# Patient Record
Sex: Female | Born: 1995 | Race: White | Hispanic: No | Marital: Single | Smoking: Current every day smoker
Health system: Southern US, Community
[De-identification: ages and names within clinical notes are randomized; demographics above are authoritative.]

---
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
## 2015-04-09 ENCOUNTER — Emergency Department
Admission: EM | Admit: 2015-04-09 | Discharge: 2015-04-09 | Disposition: A | Discharge status: Home / Self Care | Payer: Self-pay | Attending: Emergency Medicine | Admitting: Emergency Medicine

## 2015-04-09 ENCOUNTER — Emergency Department: Payer: Self-pay

## 2015-04-09 ENCOUNTER — Encounter: Payer: Self-pay | Admitting: Emergency Medicine

## 2015-04-09 DIAGNOSIS — F17210 Nicotine dependence, cigarettes, uncomplicated: Secondary | ICD-10-CM | POA: Insufficient documentation

## 2015-04-09 DIAGNOSIS — R058 Other specified cough: Secondary | ICD-10-CM

## 2015-04-09 DIAGNOSIS — R05 Cough: Secondary | ICD-10-CM

## 2015-04-09 DIAGNOSIS — J3089 Other allergic rhinitis: Secondary | ICD-10-CM | POA: Insufficient documentation

## 2015-04-09 MED ORDER — BENZONATATE 100 MG PO CAPS: 100.0000 mg | ORAL_CAPSULE | Freq: Three times a day (TID) | ORAL | Status: AC | PRN

## 2015-04-09 NOTE — Discharge Instructions (Signed)
Cough, Adult A cough helps to clear your throat and lungs. A cough may last only 2-3 weeks (acute), or it may last longer than 8 weeks (chronic). Many different things can cause a cough. A cough may be a sign of an illness or another medical condition. HOME CARE  Pay attention to any changes in your cough.  Take medicines only as told by your doctor.  If you were prescribed an antibiotic medicine, take it as told by your doctor. Do not stop taking it even if you start to feel better.  Talk with your doctor before you try using a cough medicine.  Drink enough fluid to keep your pee (urine) clear or pale yellow.  If the air is dry, use a cold steam vaporizer or humidifier in your home.  Stay away from things that make you cough at work or at home.  If your cough is worse at night, try using extra pillows to raise your head up higher while you sleep.  Do not smoke, and try not to be around smoke. If you need help quitting, ask your doctor.  Do not have caffeine.  Do not drink alcohol.  Rest as needed. GET HELP IF:  You have new problems (symptoms).  You cough up yellow fluid (pus).  Your cough does not get better after 2-3 weeks, or your cough gets worse.  Medicine does not help your cough and you are not sleeping well.  You have pain that gets worse or pain that is not helped with medicine.  You have a fever.  You are losing weight and you do not know why.  You have night sweats. GET HELP RIGHT AWAY IF:  You cough up blood.  You have trouble breathing.  Your heartbeat is very fast.   This information is not intended to replace advice given to you by your health care provider. Make sure you discuss any questions you have with your health care provider.   Document Released: 10/07/2010 Document Revised: 10/15/2014 Document Reviewed: 04/02/2014 Elsevier Interactive Patient Education 2016 [employer]  Allergic Rhinitis Allergic rhinitis is when the mucous  membranes in the nose respond to allergens. Allergens are particles in the air that cause your body to have an allergic reaction. This causes you to release allergic antibodies. Through a chain of events, these eventually cause you to release histamine into the blood stream. Although meant to protect the body, it is this release of histamine that causes your discomfort, such as frequent sneezing, congestion, and an itchy, runny nose.  CAUSES Seasonal allergic rhinitis (hay fever) is caused by pollen allergens that may come from grasses, trees, and weeds. Year-round allergic rhinitis (perennial allergic rhinitis) is caused by allergens such as house dust mites, pet dander, and mold spores. SYMPTOMS  Nasal stuffiness (congestion).  Itchy, runny nose with sneezing and tearing of the eyes. DIAGNOSIS Your health care provider can help you determine the allergen or allergens that trigger your symptoms. If you and your health care provider are unable to determine the allergen, skin or blood testing may be used. Your health care provider will diagnose your condition after taking your health history and performing a physical exam. Your health care provider may assess you for other related conditions, such as asthma, pink eye, or an ear infection. TREATMENT Allergic rhinitis does not have a cure, but it can be controlled by:  Medicines that block allergy symptoms. These may include allergy shots, nasal sprays, and oral antihistamines.  Avoiding the allergen.  Hay fever may often be treated with antihistamines in pill or nasal spray forms. Antihistamines block the effects of histamine. There are over-the-counter medicines that may help with nasal congestion and swelling around the eyes. Check with your health care provider before taking or giving this medicine. If avoiding the allergen or the medicine prescribed do not work, there are many new medicines your health care provider can prescribe. Stronger medicine  may be used if initial measures are ineffective. Desensitizing injections can be used if medicine and avoidance does not work. Desensitization is when a patient is given ongoing shots until the body becomes less sensitive to the allergen. Make sure you follow up with your health care provider if problems continue. HOME CARE INSTRUCTIONS It is not possible to completely avoid allergens, but you can reduce your symptoms by taking steps to limit your exposure to them. It helps to know exactly what you are allergic to so that you can avoid your specific triggers. SEEK MEDICAL CARE IF:  You have a fever.  You develop a cough that does not stop easily (persistent).  You have shortness of breath.  You start wheezing.  Symptoms interfere with normal daily activities.   This information is not intended to replace advice given to you by your health care provider. Make sure you discuss any questions you have with your health care provider.   Document Released: 10/19/2000 Document Revised: 02/14/2014 Document Reviewed: 10/01/2012 Elsevier Interactive Patient Education [employer]   Your exam and x-ray are normal today. You should continue to dose Mucinex as needed. Consider adding an allergy medicine as needed. Take the [facility] as needed for cough. Follow-up with [facility] as needed.

## 2015-04-09 NOTE — ED Provider Notes (Signed)
[facility] Emergency Department Provider Note ____________________________________________  Time seen: 67  I have reviewed the triage vital signs and the nursing notes.  HISTORY  Chief Complaint  Cough and Shortness of Breath  HPI Angie Rose is a 20 y.o. female presents to the ED for evaluation of symptoms following a coughing spell this morning and a sense that she was extremely short of breath. She describes awaking from sleep as if she was "gasping for air." She denies any fevers, chills, or sweats, but notes an intermittent cough over the past few days. She denies any outright chest pain, ongoing shortness of breath, or wheezing. She reports additional symptoms including stuffy nose and some mild chest tightness. She did not receive the flu vaccine this season.  History reviewed. No pertinent past medical history.  There are no active problems to display for this patient.  History reviewed. No pertinent past surgical history.  Current Outpatient Rx  Name  Route  Sig  Dispense  Refill  . benzonatate (TESSALON PERLES) 100 MG capsule   Oral   Take 1 capsule (100 mg total) by mouth 3 (three) times daily as needed for cough (Take 1-2 per dose).   30 capsule   0    Allergies Review of patient's allergies indicates no known allergies.  No family history on file.  Social History Social History  Substance Use Topics  . Smoking status: Current Every Day Smoker -- 1.00 packs/day    Types: Cigarettes  . Smokeless tobacco: Never Used  . Alcohol Use: No   Review of Systems  Constitutional: Negative for fever. Eyes: Negative for visual changes. ENT: Negative for sore throat. Cardiovascular: Negative for chest pain. Respiratory: Episodic shortness of breath this morning.  Gastrointestinal: Negative for abdominal pain, vomiting and diarrhea. Musculoskeletal: Negative for back pain. Skin: Negative for rash. Neurological: Negative for headaches, focal  weakness or numbness. ____________________________________________  PHYSICAL EXAM:  VITAL SIGNS: ED Triage Vitals  Enc Vitals Group     BP 04/09/15 0625 120/82 mmHg     Pulse Rate 04/09/15 0625 77     Resp 04/09/15 0625 18     Temp 04/09/15 0625 98 F (36.7 C)     Temp Source 04/09/15 0625 Oral     SpO2 04/09/15 0625 98 %     Weight 04/09/15 0625 230 lb (104.327 kg)     Height 04/09/15 0625  (1.6 m)     Head Cir --      Peak Flow --      Pain Score 04/09/15 0626 3     Pain Loc --      Pain Edu? --      Excl. in GC? --    Constitutional: Alert and oriented. Well appearing and in no distress. She talks in full and complete sentences without distress. Head: Normocephalic and atraumatic.      Eyes: Conjunctivae are normal. PERRL. Normal extraocular movements      Ears: Canals clear. TMs intact bilaterally. Bilateral TM scarring   Nose: No congestion/rhinorrhea.   Mouth/Throat: Mucous membranes are moist. Uvula is midline and tonsils are flat without erythema, edema, or exudate.   Neck: Supple. No thyromegaly. Hematological/Lymphatic/Immunological: No cervical lymphadenopathy. Cardiovascular: Normal rate, regular rhythm.  Respiratory: Normal respiratory effort. No wheezes/rales/rhonchi. Gastrointestinal: Soft and nontender. No distention. Musculoskeletal: Nontender with normal range of motion in all extremities.  Neurologic:  Normal gait without ataxia. Normal speech and language. No gross focal neurologic deficits are appreciated. Skin:  Skin is warm, dry and intact. No rash noted. Psychiatric: Mood and affect are normal. Patient exhibits appropriate insight and judgment. ____________________________________________   RADIOLOGY CXR IMPRESSION: No active cardiopulmonary disease. No evidence of pneumonia. ____________________________________________  INITIAL IMPRESSION / ASSESSMENT AND PLAN / ED COURSE  URI with rhinitis and postnasal drainage. Patient  without any radiologic evidence of infectious process. Vital signs are stable and exam is otherwise normal. Patient will be discharged with prescription Tessalon Perles and instruction on use of over-the-counter guaifenesin and/or allergy medicines. ____________________________________________  FINAL CLINICAL IMPRESSION(S) / ED DIAGNOSES  Final diagnoses:  Allergic cough  Other allergic rhinitis     Lissa Hoard, PA-C 04/09/15 1004  Sharman Cheek, MD 04/09/15 1544

## 2015-04-09 NOTE — ED Notes (Signed)
Pt a/o. lung sounds clear with left sounding more course than right. Non-productive cough. No acute distress

## 2015-04-09 NOTE — ED Notes (Addendum)
Pt presents to ED frequent cough the past few days. Pt states she woke up this morning and became very short of breath after a "coughing spell". Pt states she felt like she was "gasping for air". Unsure of fever at home. Pt currently has no increased work of breathing or acute distress noted at this time.

## 2015-04-11 ENCOUNTER — Encounter (HOSPITAL_COMMUNITY): Payer: Self-pay | Admitting: Family Medicine

## 2015-04-11 ENCOUNTER — Emergency Department (HOSPITAL_COMMUNITY): Payer: Self-pay

## 2015-04-11 ENCOUNTER — Emergency Department (HOSPITAL_COMMUNITY)
Admission: EM | Admit: 2015-04-11 | Discharge: 2015-04-11 | Disposition: A | Discharge status: Home / Self Care | Payer: Self-pay | Attending: Emergency Medicine | Admitting: Emergency Medicine

## 2015-04-11 DIAGNOSIS — R079 Chest pain, unspecified: Secondary | ICD-10-CM | POA: Insufficient documentation

## 2015-04-11 DIAGNOSIS — Z3202 Encounter for pregnancy test, result negative: Secondary | ICD-10-CM | POA: Insufficient documentation

## 2015-04-11 DIAGNOSIS — F419 Anxiety disorder, unspecified: Secondary | ICD-10-CM | POA: Insufficient documentation

## 2015-04-11 DIAGNOSIS — F17210 Nicotine dependence, cigarettes, uncomplicated: Secondary | ICD-10-CM | POA: Insufficient documentation

## 2015-04-11 DIAGNOSIS — Z79899 Other long term (current) drug therapy: Secondary | ICD-10-CM | POA: Insufficient documentation

## 2015-04-11 DIAGNOSIS — R0602 Shortness of breath: Secondary | ICD-10-CM | POA: Insufficient documentation

## 2015-04-11 LAB — BASIC METABOLIC PANEL
ANION GAP: 15 (ref 5–15)
BUN: 8 mg/dL (ref 6–20)
CO2: 22 mmol/L (ref 22–32)
Calcium: 10.1 mg/dL (ref 8.9–10.3)
Chloride: 105 mmol/L (ref 101–111)
Creatinine, Ser: 0.78 mg/dL (ref 0.44–1.00)
GFR calc Af Amer: >60 mL/min (ref >60)
GLUCOSE: 88 mg/dL (ref 65–99)
POTASSIUM: 3.4 mmol/L — AB (ref 3.5–5.1)
SODIUM: 142 mmol/L (ref 135–145)

## 2015-04-11 LAB — CBC WITH DIFFERENTIAL/PLATELET
BASOS ABS: 0 10*3/uL (ref 0.0–0.1)
Basophils Relative: 0 %
Eosinophils Absolute: 0 10*3/uL (ref 0.0–0.7)
Eosinophils Relative: 0 %
HEMATOCRIT: 41.4 % (ref 36.0–46.0)
HEMOGLOBIN: 14.5 g/dL (ref 12.0–15.0)
LYMPHS PCT: 24 %
Lymphs Abs: 2.7 10*3/uL (ref 0.7–4.0)
MCH: 31 pg (ref 26.0–34.0)
MCHC: 35 g/dL (ref 30.0–36.0)
MCV: 88.7 fL (ref 78.0–100.0)
Monocytes Absolute: 0.7 10*3/uL (ref 0.1–1.0)
Monocytes Relative: 6 %
NEUTROS ABS: 8 10*3/uL — AB (ref 1.7–7.7)
Neutrophils Relative %: 70 %
Platelets: 332 10*3/uL (ref 150–400)
RBC: 4.67 MIL/uL (ref 3.87–5.11)
RDW: 12.6 % (ref 11.5–15.5)
WBC: 11.4 10*3/uL — AB (ref 4.0–10.5)

## 2015-04-11 LAB — I-STAT TROPONIN, ED: TROPONIN I, POC: 0 ng/mL (ref 0.00–0.08)

## 2015-04-11 LAB — I-STAT BETA HCG BLOOD, ED (MC, WL, AP ONLY)

## 2015-04-11 LAB — D-DIMER, QUANTITATIVE (NOT AT ARMC)

## 2015-04-11 MED ORDER — IPRATROPIUM-ALBUTEROL 0.5-2.5 (3) MG/3ML IN SOLN
3.0000 mL | Freq: Once | RESPIRATORY_TRACT | Status: AC
Start: 2015-04-11 — End: 2015-04-11
  Administered 2015-04-11: 3 mL via RESPIRATORY_TRACT
  Filled 2015-04-11: qty 3

## 2015-04-11 MED ORDER — ALBUTEROL SULFATE HFA 108 (90 BASE) MCG/ACT IN AERS
2.0000 | INHALATION_SPRAY | Freq: Once | RESPIRATORY_TRACT | Status: AC
  Administered 2015-04-11: 2 via RESPIRATORY_TRACT
  Filled 2015-04-11: qty 6.7

## 2015-04-11 NOTE — ED Notes (Signed)
Pt to room at 1735

## 2015-04-11 NOTE — ED Notes (Addendum)
Pt sts that she has been having panic attacks since taking benadryl, mucinex and cough medicine all together. sts also zyrtec. sts she was dx with allergies. sts still having trouble breathing and chest pressure. sts after stopping the meds the symptoms went away.

## 2015-04-11 NOTE — ED Provider Notes (Signed)
CSN: [id number]     Arrival date & time 04/11/15  1610 History   First MD Initiated Contact with Patient 04/11/15 1718     Chief Complaint  Patient presents with  . Chest Pain  . Anxiety    Kynzlee N Rusnak is a 20 y.o. female who presents to the ED complaining of substernal chest pain with associated slight shortness of breath since 1:30 pm today. She reports she was seen in the [facility] regional emergency department approximately 2 days ago for similar cough and shortness of breath. She was prescribed Benadryl, Mucinex and Tessalon Perles. She reports taking these medications then had some shortness of breath, heart racing and what she believes was a panic attack. She reports this seemed to resolve after she stopped taking Benadryl. She reports seeing her primary care provider yesterday started her on Zyrtec. She reports she is taking 2 days of Zyrtec 10 today while lying in bed she had substernal nonradiating chest pain with associated shortness of breath. She currently complains of  2 out of 10 substernal chest pain with slight shortness of breath. She is unable to identify alleviating or aggravating factors. She is on Nexplanon for birth control and is a smoker. She denies recent long travel. She denies personal or close family history of PEs or DVTs. She denies personal or close family history of blood clotting disorders such as factor V Leiden, protein C or S deficiency. She denies fevers, leg pain, leg swelling, hemoptysis, wheezing, abdominal pain, nausea, vomiting, diarrhea or rashes.   Patient is a 20 y.o. female presenting with chest pain and anxiety. The history is provided by the patient. No language interpreter was used.  Chest Pain Associated symptoms: anxiety and shortness of breath   Associated symptoms: no abdominal pain, no back pain, no cough, no fever, no headache, no nausea, no numbness, no palpitations and not vomiting   Anxiety Associated symptoms include chest pain. Pertinent  negatives include no abdominal pain, chills, congestion, coughing, fever, headaches, nausea, neck pain, numbness, rash, sore throat or vomiting.    History reviewed. No pertinent past medical history. History reviewed. No pertinent past surgical history. History reviewed. No pertinent family history. Social History  Substance Use Topics  . Smoking status: Current Every Day Smoker -- 1.00 packs/day    Types: Cigarettes  . Smokeless tobacco: Never Used  . Alcohol Use: No   OB History    No data available     Review of Systems  Constitutional: Negative for fever and chills.  HENT: Negative for congestion and sore throat.   Eyes: Negative for visual disturbance.  Respiratory: Positive for chest tightness and shortness of breath. Negative for cough and wheezing.   Cardiovascular: Positive for chest pain. Negative for palpitations.  Gastrointestinal: Negative for nausea, vomiting, abdominal pain and diarrhea.  Genitourinary: Negative for dysuria.  Musculoskeletal: Negative for back pain and neck pain.  Skin: Negative for rash.  Neurological: Negative for syncope, light-headedness, numbness and headaches.      Allergies  Review of patient's allergies indicates no known allergies.  Home Medications   Prior to Admission medications   Medication Sig Start Date End Date Taking? Authorizing Provider  benzonatate (TESSALON PERLES) 100 MG capsule Take 1 capsule (100 mg total) by mouth 3 (three) times daily as needed for cough (Take 1-2 per dose). 04/09/15  Yes Jenise V Bacon Menshew, PA-C  cetirizine (ZYRTEC) 10 MG tablet Take 10 mg by mouth daily.    Yes Historical Provider, MD  diphenhydrAMINE (BENADRYL) 25 MG tablet Take 25 mg by mouth 3 (three) times daily as needed for itching or allergies.   Yes Historical Provider, MD  guaifenesin (HUMIBID E) 400 MG TABS tablet Take 400 mg by mouth every 4 (four) hours.   Yes Historical Provider, MD  ibuprofen (ADVIL,MOTRIN) 200 MG tablet Take 400  mg by mouth every 6 (six) hours as needed.   Yes Historical Provider, MD   BP 132/79 mmHg  Pulse 86  Temp(Src) 98.2 F (36.8 C)  Resp 18  SpO2 98% Physical Exam  Constitutional: She is oriented to person, place, and time. She appears well-developed and well-nourished. No distress.  Nontoxic appearing.  HENT:  Head: Normocephalic and atraumatic.  Right Ear: External ear normal.  Left Ear: External ear normal.  Mouth/Throat: Oropharynx is clear and moist.  Eyes: Conjunctivae are normal. Pupils are equal, round, and reactive to light. Right eye exhibits no discharge. Left eye exhibits no discharge.  Neck: Normal range of motion. Neck supple. No JVD present. No tracheal deviation present.  Cardiovascular: Normal rate, regular rhythm, normal heart sounds and intact distal pulses.  Exam reveals no gallop and no friction rub.   No murmur heard. Bilateral radial and posterior tibialis pulses are intact.  Pulmonary/Chest: Effort normal and breath sounds normal. No respiratory distress. She has no wheezes. She has no rales. She exhibits tenderness.  Lungs are clear to auscultation bilaterally. Substernal chest wall tenderness to palpation which reproduces her chest pain.  Abdominal: Soft. There is no tenderness. There is no guarding.  Musculoskeletal: She exhibits no edema or tenderness.  No lower extremity edema or tenderness.  Lymphadenopathy:    She has no cervical adenopathy.  Neurological: She is alert and oriented to person, place, and time. Coordination normal.  Skin: Skin is warm and dry. No rash noted. She is not diaphoretic. No erythema. No pallor.  Psychiatric: Her behavior is normal. Her mood appears anxious.  Nursing note and vitals reviewed.   ED Course  Procedures (including critical care time) Labs Review Labs Reviewed  BASIC METABOLIC PANEL - Abnormal; Notable for the following:    Potassium 3.4 (*)    All other components within normal limits  CBC WITH  DIFFERENTIAL/PLATELET - Abnormal; Notable for the following:    WBC 11.4 (*)    Neutro Abs 8.0 (*)    All other components within normal limits  D-DIMER, QUANTITATIVE (NOT AT [facility])  I-STAT BETA HCG BLOOD, ED ([facility], [facility], [facility] ONLY)  I-STAT TROPOININ, ED    Imaging Review Dg Chest 2 View  04/11/2015  CLINICAL DATA:  Chest pain and shortness of breath.  Smoker. EXAM: CHEST  2 VIEW COMPARISON:  04/09/2015 FINDINGS: Midline trachea.  Normal heart size and mediastinal contours. Sharp costophrenic angles.  No pneumothorax.  Clear lungs. IMPRESSION: No active cardiopulmonary disease. Electronically Signed   By: Jeronimo Greaves M.D.   On: 04/11/2015 18:48   I have personally reviewed and evaluated these images and lab results as part of my medical decision-making.   EKG Interpretation   Date/Time:  Saturday April 11 2015 17:57:50 EST Ventricular Rate:  81 PR Interval:  164 QRS Duration: 86 QT Interval:  368 QTC Calculation: 427 R Axis:   80 Text Interpretation:  Sinus rhythm Borderline T abnormalities, anterior  leads No old tracing to compare Confirmed by GOLDSTON  MD, SCOTT ([id number]) on  04/11/2015 6:18:31 PM      Filed Vitals:   04/11/15 1632 04/11/15 1800  BP: 133/89 132/79  Pulse: 92 86  Temp: 98.2 F (36.8 C)   Resp: 18 18  SpO2: 95% 98%     MDM   Meds given in ED:  Medications  albuterol (PROVENTIL HFA;VENTOLIN HFA) 108 (90 Base) MCG/ACT inhaler 2 puff (not administered)  ipratropium-albuterol (DUONEB) 0.5-2.5 (3) MG/3ML nebulizer solution 3 mL (3 mLs Nebulization Given 04/11/15 1951)    New Prescriptions   No medications on file    Final diagnoses:  Chest pain, unspecified chest pain type  Anxiety   This is a 20 y.o. female who presents to the ED complaining of substernal chest pain with associated slight shortness of breath since 1:30 pm today. She reports she was seen in the [facility] regional emergency department approximately 2 days ago for similar cough and shortness of  breath. She was prescribed Benadryl, Mucinex and Tessalon Perles. She reports taking these medications then had some shortness of breath, heart racing and what she believes was a panic attack. She reports this seemed to resolve after she stopped taking Benadryl. She reports seeing her primary care provider yesterday started her on Zyrtec. She reports she is taking 2 days of Zyrtec 10 today while lying in bed she had substernal nonradiating chest pain with associated shortness of breath. She currently complains of  2 out of 10 substernal chest pain with slight shortness of breath. She is unable to identify alleviating or aggravating factors. She is on Nexplanon for birth control and is a smoker.  On exam the patient is afebrile nontoxic appearing. Patient does appear slightly anxious. Her lungs are clear to auscultation bilaterally. No increased work of breathing. No chest wall tenderness to palpation. No lower extremity edema or tenderness. EKG shows sinus rhythm with borderline T-wave abnormalities. No old tracing is available for comparison. Will provide with DuoNeb. Blood work pending. Because of patient's use of endogenous estrogens and smoking will check a d-dimer to rule out PE. Chest x-ray is unremarkable. D-dimer is not elevated. CBC is only for slight leukocytosis of 11,000. Troponin is 0. She has a negative i-STAT hCG. BMP is unremarkable. I reevaluation patient reports slight improvement after DuoNeb in the emergency department. Will provide with albuterol inhaler at discharge. Patient's workup unremarkable here. I encouraged close follow-up by her primary care provider and discuss strict and specific return precautions related to chest pain. I also discussed smoking cessation with the patient for greater than 5 minutes. I advised the patient to follow-up with their primary care provider this week. I advised the patient to return to the emergency department with new or worsening symptoms or new  concerns. The patient verbalized understanding and agreement with plan.    This patient was discussed with Dr. Criss AlvineGoldston who agrees with assessment and plan.     Everlene FarrierWilliam Bellamy Rubey, PA-C 04/11/15 2206  Pricilla LovelessScott Goldston, MD 04/12/15 [phone]

## 2015-04-11 NOTE — Discharge Instructions (Signed)
Nonspecific Chest Pain  °Chest pain can be caused by many different conditions. There is always a chance that your pain could be related to something serious, such as a heart attack or a blood clot in your lungs. Chest pain can also be caused by conditions that are not life-threatening. If you have chest pain, it is very important to follow up with your health care provider. °CAUSES  °Chest pain can be caused by: °· Heartburn. °· Pneumonia or bronchitis. °· Anxiety or stress. °· Inflammation around your heart (pericarditis) or lung (pleuritis or pleurisy). °· A blood clot in your lung. °· A collapsed lung (pneumothorax). It can develop suddenly on its own (spontaneous pneumothorax) or from trauma to the chest. °· Shingles infection (varicella-zoster virus). °· Heart attack. °· Damage to the bones, muscles, and cartilage that make up your chest wall. This can include: °¨ Bruised bones due to injury. °¨ Strained muscles or cartilage due to frequent or repeated coughing or overwork. °¨ Fracture to one or more ribs. °¨ Sore cartilage due to inflammation (costochondritis). °RISK FACTORS  °Risk factors for chest pain may include: °· Activities that increase your risk for trauma or injury to your chest. °· Respiratory infections or conditions that cause frequent coughing. °· Medical conditions or overeating that can cause heartburn. °· Heart disease or family history of heart disease. °· Conditions or health behaviors that increase your risk of developing a blood clot. °· Having had chicken pox (varicella zoster). °SIGNS AND SYMPTOMS °Chest pain can feel like: °· Burning or tingling on the surface of your chest or deep in your chest. °· Crushing, pressure, aching, or squeezing pain. °· Dull or sharp pain that is worse when you move, cough, or take a deep breath. °· Pain that is also felt in your back, neck, shoulder, or arm, or pain that spreads to any of these areas. °Your chest pain may come and go, or it may stay  constant. °DIAGNOSIS °Lab tests or other studies may be needed to find the cause of your pain. Your health care provider may have you take a test called an ambulatory ECG (electrocardiogram). An ECG records your heartbeat patterns at the time the test is performed. You may also have other tests, such as: °· Transthoracic echocardiogram (TTE). During echocardiography, sound waves are used to create a picture of all of the heart structures and to look at how blood flows through your heart. °· Transesophageal echocardiogram (TEE). This is a more advanced imaging test that obtains images from inside your body. It allows your health care provider to see your heart in finer detail. °· Cardiac monitoring. This allows your health care provider to monitor your heart rate and rhythm in real time. °· Holter monitor. This is a portable device that records your heartbeat and can help to diagnose abnormal heartbeats. It allows your health care provider to track your heart activity for several days, if needed. °· Stress tests. These can be done through exercise or by taking medicine that makes your heart beat more quickly. °· Blood tests. °· Imaging tests. °TREATMENT  °Your treatment depends on what is causing your chest pain. Treatment may include: °· Medicines. These may include: °¨ Acid blockers for heartburn. °¨ Anti-inflammatory medicine. °¨ Pain medicine for inflammatory conditions. °¨ Antibiotic medicine, if an infection is present. °¨ Medicines to dissolve blood clots. °¨ Medicines to treat coronary artery disease. °· Supportive care for conditions that do not require medicines. This may include: °¨ Resting. °¨ Applying heat   or cold packs to injured areas.  Limiting activities until pain decreases. HOME CARE INSTRUCTIONS  If you were prescribed an antibiotic medicine, finish it all even if you start to feel better.  Avoid any activities that bring on chest pain.  Do not use any tobacco products, including  cigarettes, chewing tobacco, or electronic cigarettes. If you need help quitting, ask your health care provider.  Do not drink alcohol.  Take medicines only as directed by your health care provider.  Keep all follow-up visits as directed by your health care provider. This is important. This includes any further testing if your chest pain does not go away.  If heartburn is the cause for your chest pain, you may be told to keep your head raised (elevated) while sleeping. This reduces the chance that acid will go from your stomach into your esophagus.  Make lifestyle changes as directed by your health care provider. These may include:  Getting regular exercise. Ask your health care provider to suggest some activities that are safe for you.  Eating a heart-healthy diet. A registered dietitian can help you to learn healthy eating options.  Maintaining a healthy weight.  Managing diabetes, if necessary.  Reducing stress. SEEK MEDICAL CARE IF:  Your chest pain does not go away after treatment.  You have a rash with blisters on your chest.  You have a fever. SEEK IMMEDIATE MEDICAL CARE IF:   Your chest pain is worse.  You have an increasing cough, or you cough up blood.  You have severe abdominal pain.  You have severe weakness.  You faint.  You have chills.  You have sudden, unexplained chest discomfort.  You have sudden, unexplained discomfort in your arms, back, neck, or jaw.  You have shortness of breath at any time.  You suddenly start to sweat, or your skin gets clammy.  You feel nauseous or you vomit.  You suddenly feel light-headed or dizzy.  Your heart begins to beat quickly, or it feels like it is skipping beats. These symptoms may represent a serious problem that is an emergency. Do not wait to see if the symptoms will go away. Get medical help right away. Call your local emergency services (911 in the [place] Do not drive yourself to the hospital.   This  information is not intended to replace advice given to you by your health care provider. Make sure you discuss any questions you have with your health care provider.   Document Released: 11/03/2004 Document Revised: 02/14/2014 Document Reviewed: 08/30/2013 Elsevier Interactive Patient Education 2016 [employer] Albuterol inhalation aerosol What is this medicine? ALBUTEROL (al [address] ole) is a bronchodilator. It helps open up the airways in your lungs to make it easier to breathe. This medicine is used to treat and to prevent bronchospasm. This medicine may be used for other purposes; ask your health care provider or pharmacist if you have questions. What should I tell my health care provider before I take this medicine? They need to know if you have any of the following conditions: -diabetes -heart disease or irregular heartbeat -high blood pressure -pheochromocytoma -seizures -thyroid disease -an unusual or allergic reaction to albuterol, levalbuterol, sulfites, other medicines, foods, dyes, or preservatives -pregnant or trying to get pregnant -breast-feeding How should I use this medicine? This medicine is for inhalation through the mouth. Follow the directions on your prescription label. Take your medicine at regular intervals. Do not use more often than directed. Make sure that you are using your  inhaler correctly. Ask you doctor or health care provider if you have any questions. Talk to your pediatrician regarding the use of this medicine in children. Special care may be needed. Overdosage: If you think you have taken too much of this medicine contact a poison control center or emergency room at once. NOTE: This medicine is only for you. Do not share this medicine with others. What if I miss a dose? If you miss a dose, use it as soon as you can. If it is almost time for your next dose, use only that dose. Do not use double or extra doses. What may interact with this  medicine? -anti-infectives like chloroquine and pentamidine -caffeine -cisapride -diuretics -medicines for colds -medicines for depression or for emotional or psychotic conditions -medicines for weight loss including some herbal products -methadone -some antibiotics like clarithromycin, erythromycin, levofloxacin, and linezolid -some heart medicines -steroid hormones like dexamethasone, cortisone, hydrocortisone -theophylline -thyroid hormones This list may not describe all possible interactions. Give your health care provider a list of all the medicines, herbs, non-prescription drugs, or dietary supplements you use. Also tell them if you smoke, drink alcohol, or use illegal drugs. Some items may interact with your medicine. What should I watch for while using this medicine? Tell your doctor or health care professional if your symptoms do not improve. Do not use extra albuterol. If your asthma or bronchitis gets worse while you are using this medicine, call your doctor right away. If your mouth gets dry try chewing sugarless gum or sucking hard candy. Drink water as directed. What side effects may I notice from receiving this medicine? Side effects that you should report to your doctor or health care professional as soon as possible: -allergic reactions like skin rash, itching or hives, swelling of the face, lips, or tongue -breathing problems -chest pain -feeling faint or lightheaded, falls -high blood pressure -irregular heartbeat -fever -muscle cramps or weakness -pain, tingling, numbness in the hands or feet -vomiting Side effects that usually do not require medical attention (report to your doctor or health care professional if they continue or are bothersome): -cough -difficulty sleeping -headache -nervousness or trembling -stomach upset -stuffy or runny nose -throat irritation -unusual taste This list may not describe all possible side effects. Call your doctor for  medical advice about side effects. You may report side effects to FDA at 1-800-FDA-1088. Where should I keep my medicine? Keep out of the reach of children. Store at room temperature between 15 and 30 degrees C (59 and 86 degrees F). The contents are under pressure and may burst when exposed to heat or flame. Do not freeze. This medicine does not work as well if it is too cold. Throw away any unused medicine after the expiration date. Inhalers need to be thrown away after the labeled number of puffs have been used or by the expiration date; whichever comes first. Ventolin HFA should be thrown away 12 months after removing from foil pouch. Check the instructions that come with your medicine. NOTE: This sheet is a summary. It may not cover all possible information. If you have questions about this medicine, talk to your doctor, pharmacist, or health care provider.    2016, [employer]. (2012-07-12 10:57:17)

## 2017-07-11 IMAGING — DX DG CHEST 2V
2 series · 2 of 2 positions shown · non-contrast
Comparison: 04/09/2015

CLINICAL DATA: Chest pain and shortness of breath.  Smoker.

EXAM:
CHEST  2 VIEW

[w chest pa]
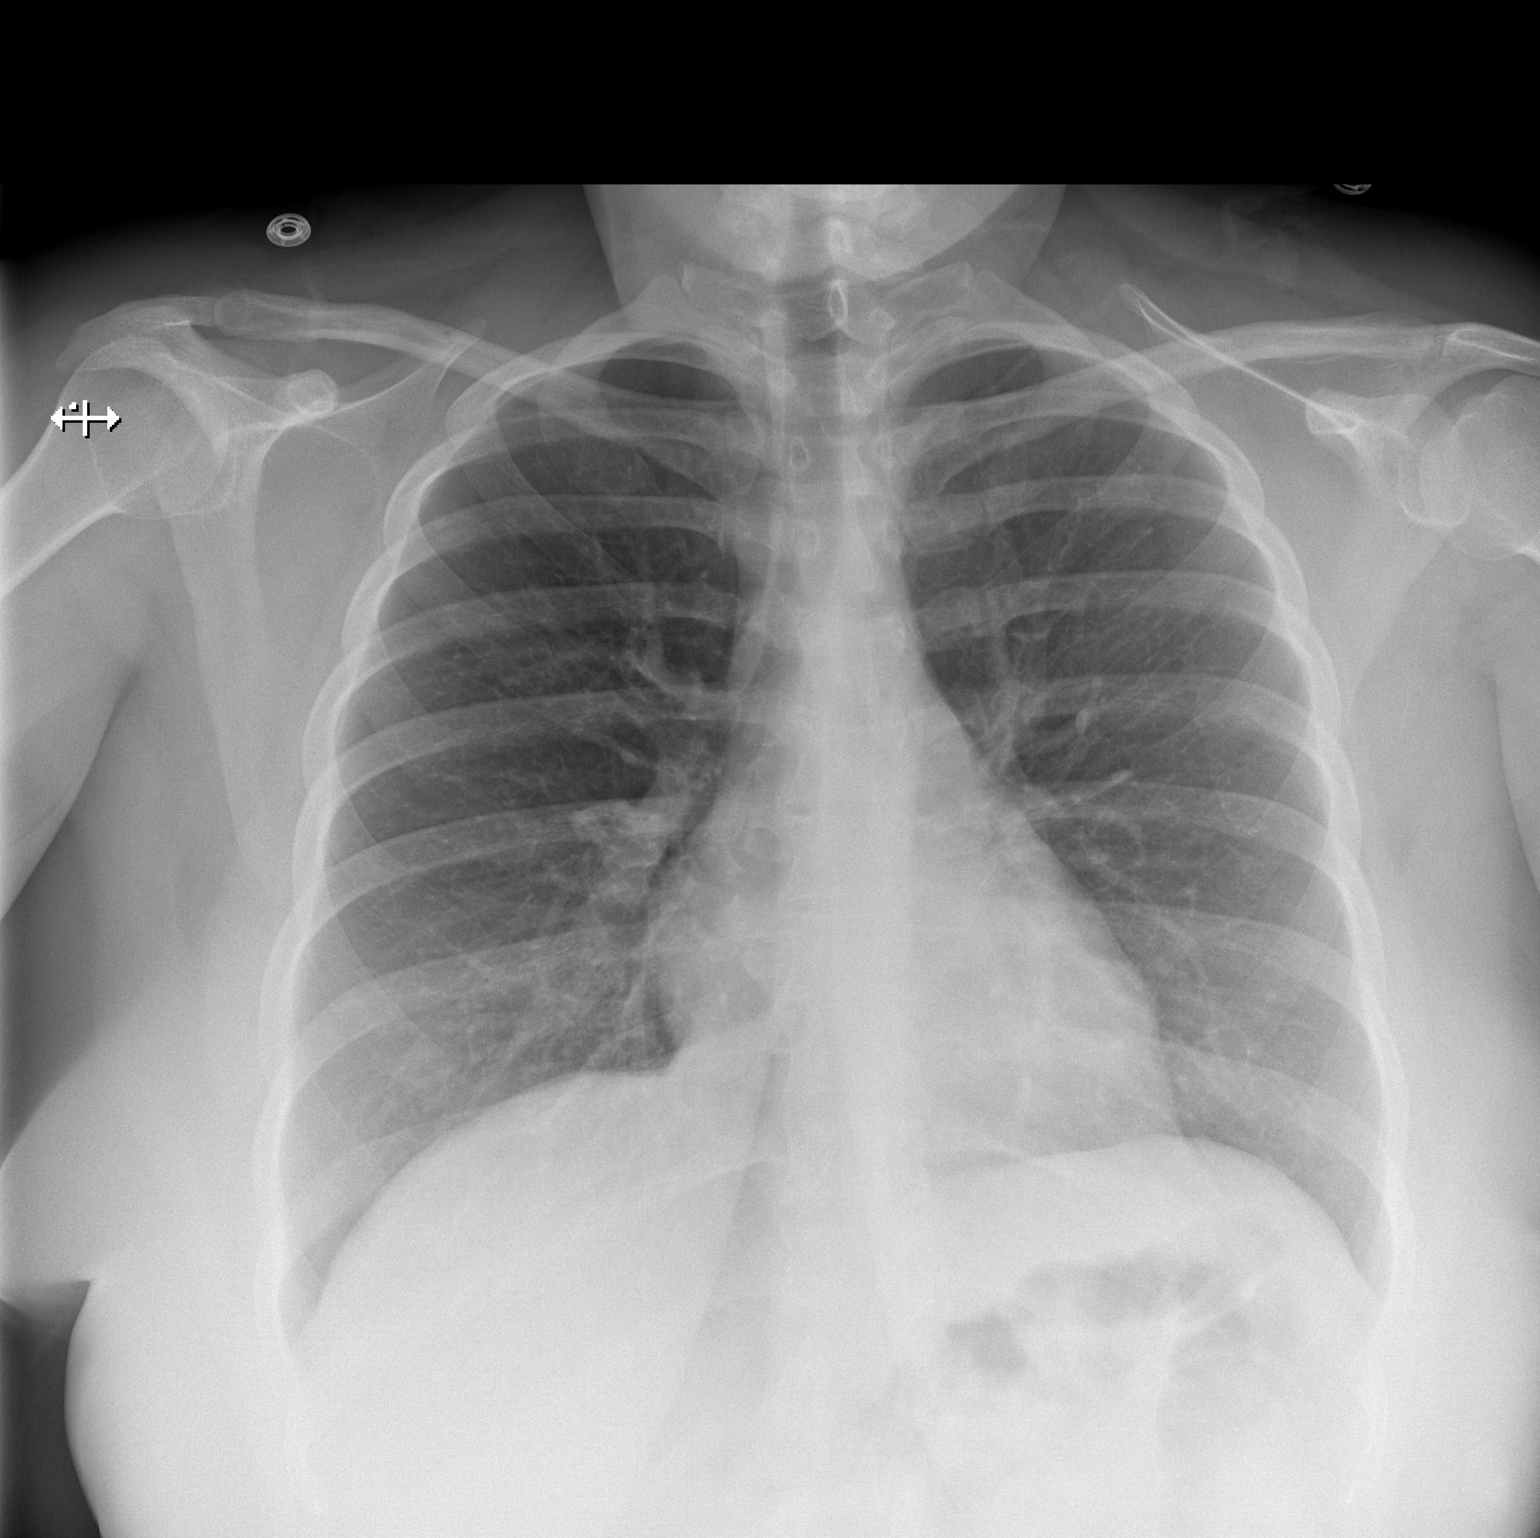

[w chest lat]
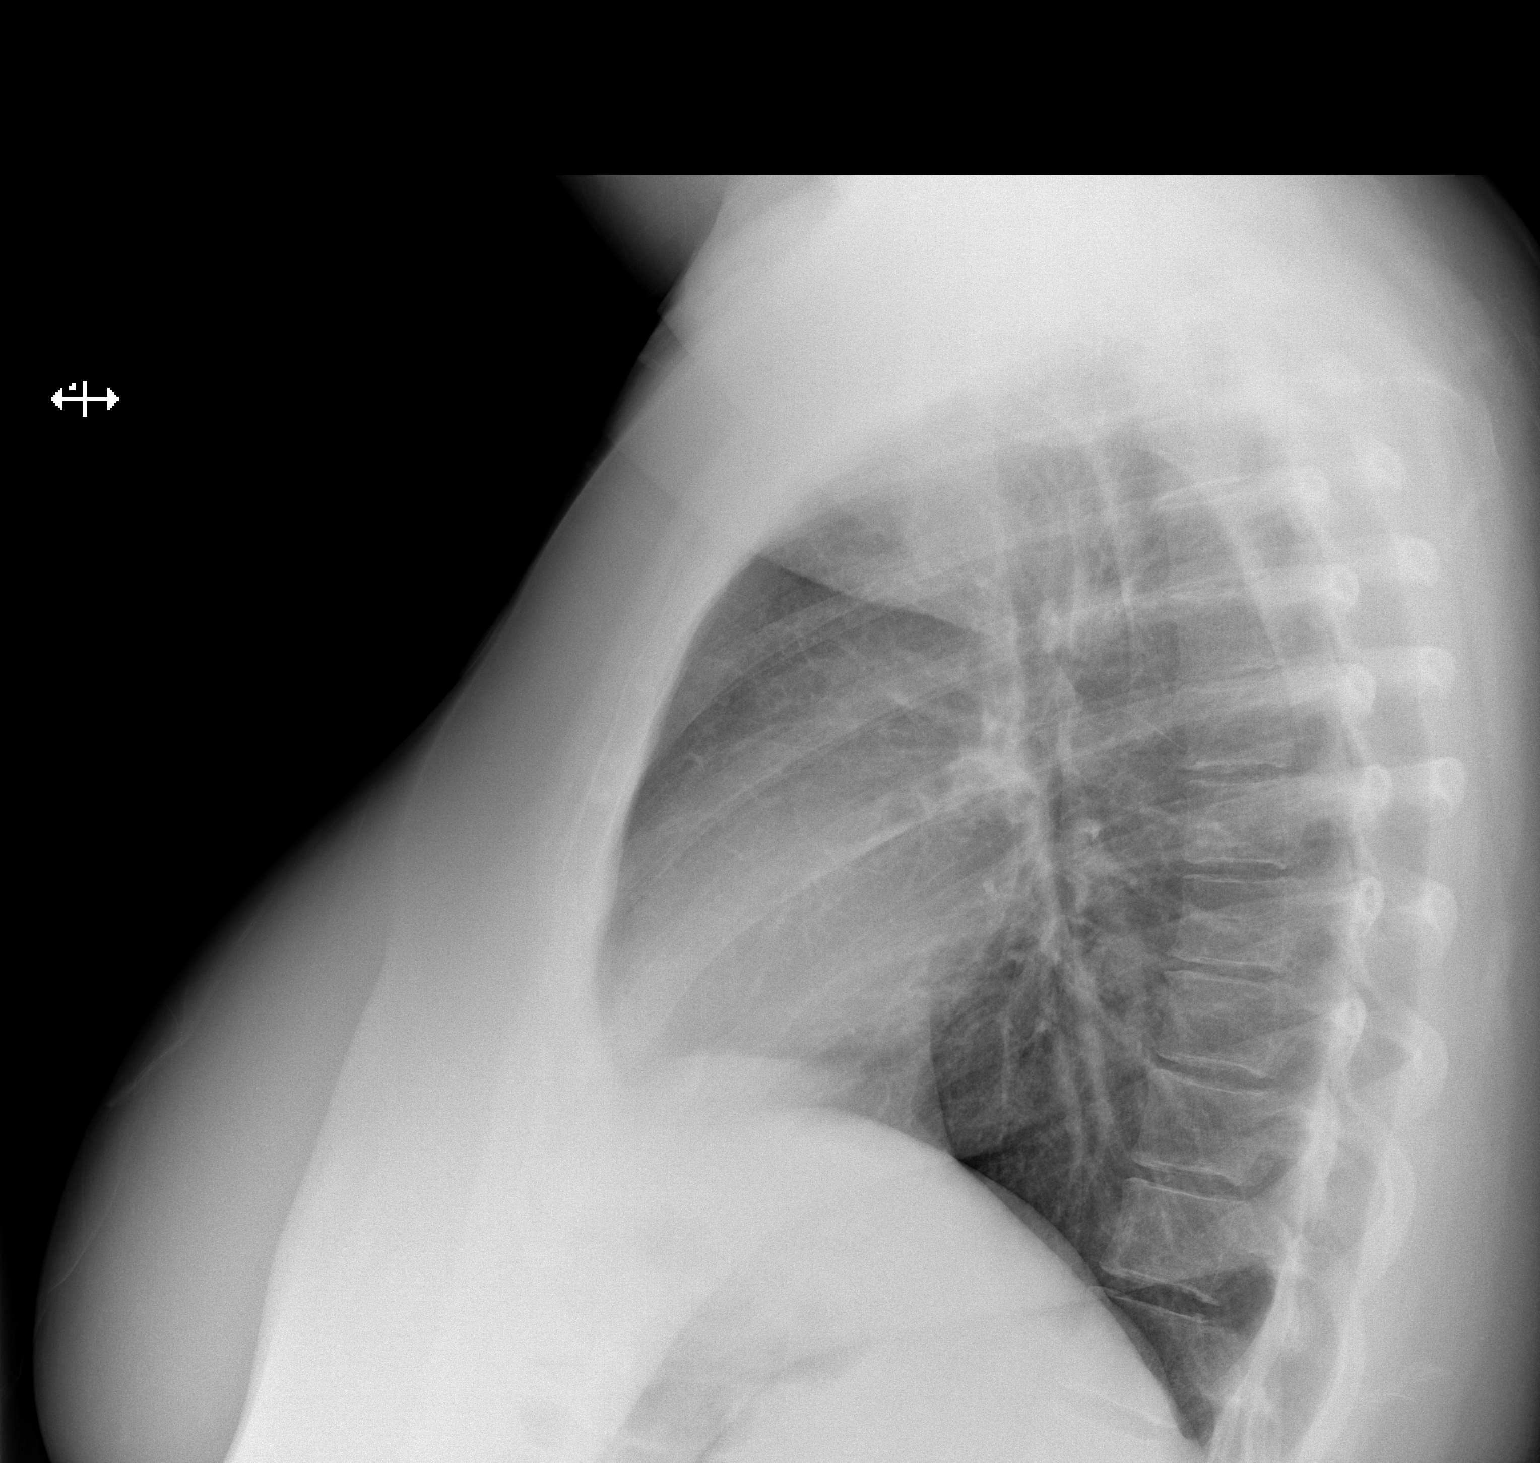

[2 of 2 positions shown; findings below may reference images not displayed]

FINDINGS: Midline trachea.  Normal heart size and mediastinal contours.

Sharp costophrenic angles.  No pneumothorax.  Clear lungs.
IMPRESSION: No active cardiopulmonary disease.

## 2021-10-18 ENCOUNTER — Other Ambulatory Visit: Payer: Self-pay | Admitting: Physician Assistant

## 2021-10-18 DIAGNOSIS — N9412 Deep dyspareunia: Secondary | ICD-10-CM

## 2021-12-27 DIAGNOSIS — N3000 Acute cystitis without hematuria: Secondary | ICD-10-CM | POA: Diagnosis not present

## 2021-12-27 DIAGNOSIS — O219 Vomiting of pregnancy, unspecified: Secondary | ICD-10-CM | POA: Diagnosis not present

## 2021-12-27 DIAGNOSIS — O2311 Infections of bladder in pregnancy, first trimester: Secondary | ICD-10-CM | POA: Diagnosis not present

## 2021-12-27 DIAGNOSIS — O3481 Maternal care for other abnormalities of pelvic organs, first trimester: Secondary | ICD-10-CM | POA: Diagnosis not present

## 2021-12-27 DIAGNOSIS — N83201 Unspecified ovarian cyst, right side: Secondary | ICD-10-CM | POA: Diagnosis not present

## 2021-12-27 DIAGNOSIS — Z3A01 Less than 8 weeks gestation of pregnancy: Secondary | ICD-10-CM | POA: Diagnosis not present

## 2022-01-04 DIAGNOSIS — B3731 Acute candidiasis of vulva and vagina: Secondary | ICD-10-CM | POA: Diagnosis not present

## 2022-01-04 DIAGNOSIS — O219 Vomiting of pregnancy, unspecified: Secondary | ICD-10-CM | POA: Diagnosis not present

## 2022-01-04 DIAGNOSIS — O99111 Other diseases of the blood and blood-forming organs and certain disorders involving the immune mechanism complicating pregnancy, first trimester: Secondary | ICD-10-CM | POA: Diagnosis not present

## 2022-01-04 DIAGNOSIS — O23591 Infection of other part of genital tract in pregnancy, first trimester: Secondary | ICD-10-CM | POA: Diagnosis not present

## 2022-01-04 DIAGNOSIS — O99891 Other specified diseases and conditions complicating pregnancy: Secondary | ICD-10-CM | POA: Diagnosis not present

## 2022-01-04 DIAGNOSIS — O99331 Smoking (tobacco) complicating pregnancy, first trimester: Secondary | ICD-10-CM | POA: Diagnosis not present

## 2022-01-04 DIAGNOSIS — R1013 Epigastric pain: Secondary | ICD-10-CM | POA: Diagnosis not present

## 2022-01-04 DIAGNOSIS — D72829 Elevated white blood cell count, unspecified: Secondary | ICD-10-CM | POA: Diagnosis not present

## 2022-01-04 DIAGNOSIS — Z3A01 Less than 8 weeks gestation of pregnancy: Secondary | ICD-10-CM | POA: Diagnosis not present

## 2022-01-04 DIAGNOSIS — F17210 Nicotine dependence, cigarettes, uncomplicated: Secondary | ICD-10-CM | POA: Diagnosis not present

## 2022-02-07 DIAGNOSIS — Z419 Encounter for procedure for purposes other than remedying health state, unspecified: Secondary | ICD-10-CM | POA: Diagnosis not present

## 2022-03-10 DIAGNOSIS — Z419 Encounter for procedure for purposes other than remedying health state, unspecified: Secondary | ICD-10-CM | POA: Diagnosis not present

## 2022-04-08 DIAGNOSIS — Z419 Encounter for procedure for purposes other than remedying health state, unspecified: Secondary | ICD-10-CM | POA: Diagnosis not present

## 2022-05-09 DIAGNOSIS — Z419 Encounter for procedure for purposes other than remedying health state, unspecified: Secondary | ICD-10-CM | POA: Diagnosis not present

## 2022-06-08 DIAGNOSIS — Z419 Encounter for procedure for purposes other than remedying health state, unspecified: Secondary | ICD-10-CM | POA: Diagnosis not present

## 2022-07-09 DIAGNOSIS — Z419 Encounter for procedure for purposes other than remedying health state, unspecified: Secondary | ICD-10-CM | POA: Diagnosis not present

## 2022-08-08 DIAGNOSIS — Z419 Encounter for procedure for purposes other than remedying health state, unspecified: Secondary | ICD-10-CM | POA: Diagnosis not present

## 2022-08-24 DIAGNOSIS — O99344 Other mental disorders complicating childbirth: Secondary | ICD-10-CM | POA: Diagnosis not present

## 2022-08-24 DIAGNOSIS — F419 Anxiety disorder, unspecified: Secondary | ICD-10-CM | POA: Diagnosis not present

## 2022-08-24 DIAGNOSIS — O480 Post-term pregnancy: Secondary | ICD-10-CM | POA: Diagnosis not present

## 2022-08-24 DIAGNOSIS — O99334 Smoking (tobacco) complicating childbirth: Secondary | ICD-10-CM | POA: Diagnosis not present

## 2022-08-24 DIAGNOSIS — O99214 Obesity complicating childbirth: Secondary | ICD-10-CM | POA: Diagnosis not present

## 2022-08-24 DIAGNOSIS — O9902 Anemia complicating childbirth: Secondary | ICD-10-CM | POA: Diagnosis not present

## 2022-08-24 DIAGNOSIS — F17210 Nicotine dependence, cigarettes, uncomplicated: Secondary | ICD-10-CM | POA: Diagnosis not present

## 2022-08-24 DIAGNOSIS — Z3A40 40 weeks gestation of pregnancy: Secondary | ICD-10-CM | POA: Diagnosis not present

## 2022-08-25 DIAGNOSIS — Z3A40 40 weeks gestation of pregnancy: Secondary | ICD-10-CM | POA: Diagnosis not present

## 2022-08-25 DIAGNOSIS — O9902 Anemia complicating childbirth: Secondary | ICD-10-CM | POA: Diagnosis not present

## 2022-08-25 DIAGNOSIS — O99344 Other mental disorders complicating childbirth: Secondary | ICD-10-CM | POA: Diagnosis not present

## 2022-09-08 DIAGNOSIS — Z419 Encounter for procedure for purposes other than remedying health state, unspecified: Secondary | ICD-10-CM | POA: Diagnosis not present

## 2022-09-12 DIAGNOSIS — Z139 Encounter for screening, unspecified: Secondary | ICD-10-CM | POA: Diagnosis not present

## 2022-10-09 DIAGNOSIS — Z419 Encounter for procedure for purposes other than remedying health state, unspecified: Secondary | ICD-10-CM | POA: Diagnosis not present

## 2022-10-14 DIAGNOSIS — Z30017 Encounter for initial prescription of implantable subdermal contraceptive: Secondary | ICD-10-CM | POA: Diagnosis not present

## 2022-10-14 DIAGNOSIS — Z1332 Encounter for screening for maternal depression: Secondary | ICD-10-CM | POA: Diagnosis not present

## 2022-11-08 DIAGNOSIS — Z419 Encounter for procedure for purposes other than remedying health state, unspecified: Secondary | ICD-10-CM | POA: Diagnosis not present

## 2022-12-09 DIAGNOSIS — Z419 Encounter for procedure for purposes other than remedying health state, unspecified: Secondary | ICD-10-CM | POA: Diagnosis not present

## 2023-01-08 DIAGNOSIS — Z419 Encounter for procedure for purposes other than remedying health state, unspecified: Secondary | ICD-10-CM | POA: Diagnosis not present

## 2023-02-08 DIAGNOSIS — Z419 Encounter for procedure for purposes other than remedying health state, unspecified: Secondary | ICD-10-CM | POA: Diagnosis not present

## 2023-03-11 DIAGNOSIS — Z419 Encounter for procedure for purposes other than remedying health state, unspecified: Secondary | ICD-10-CM | POA: Diagnosis not present

## 2023-04-08 DIAGNOSIS — Z419 Encounter for procedure for purposes other than remedying health state, unspecified: Secondary | ICD-10-CM | POA: Diagnosis not present

## 2023-05-20 DIAGNOSIS — Z419 Encounter for procedure for purposes other than remedying health state, unspecified: Secondary | ICD-10-CM | POA: Diagnosis not present

## 2023-06-19 DIAGNOSIS — Z419 Encounter for procedure for purposes other than remedying health state, unspecified: Secondary | ICD-10-CM | POA: Diagnosis not present

## 2023-07-20 DIAGNOSIS — Z419 Encounter for procedure for purposes other than remedying health state, unspecified: Secondary | ICD-10-CM | POA: Diagnosis not present

## 2023-08-19 DIAGNOSIS — Z419 Encounter for procedure for purposes other than remedying health state, unspecified: Secondary | ICD-10-CM | POA: Diagnosis not present

## 2023-09-19 DIAGNOSIS — Z419 Encounter for procedure for purposes other than remedying health state, unspecified: Secondary | ICD-10-CM | POA: Diagnosis not present

## 2023-10-20 DIAGNOSIS — Z419 Encounter for procedure for purposes other than remedying health state, unspecified: Secondary | ICD-10-CM | POA: Diagnosis not present

## 2023-11-19 DIAGNOSIS — Z419 Encounter for procedure for purposes other than remedying health state, unspecified: Secondary | ICD-10-CM | POA: Diagnosis not present

## 2024-01-19 DIAGNOSIS — Z419 Encounter for procedure for purposes other than remedying health state, unspecified: Secondary | ICD-10-CM | POA: Diagnosis not present
